# Patient Record
Sex: Male | Born: 1990 | Race: Black or African American | Hispanic: No | Marital: Single | Smoking: Never smoker
Health system: Southern US, Community
[De-identification: ages and names within clinical notes are randomized; demographics above are authoritative.]

## PROBLEM LIST (undated history)

## (undated) DIAGNOSIS — E119 Type 2 diabetes mellitus without complications: Secondary | ICD-10-CM

## (undated) DIAGNOSIS — I671 Cerebral aneurysm, nonruptured: Secondary | ICD-10-CM

## (undated) DIAGNOSIS — I10 Essential (primary) hypertension: Secondary | ICD-10-CM

## (undated) HISTORY — DX: Type 2 diabetes mellitus without complications: E11.9

## (undated) HISTORY — DX: Essential (primary) hypertension: I10

---

## 2016-07-10 DIAGNOSIS — I671 Cerebral aneurysm, nonruptured: Secondary | ICD-10-CM

## 2016-07-10 HISTORY — DX: Cerebral aneurysm, nonruptured: I67.1

## 2017-07-18 ENCOUNTER — Inpatient Hospital Stay: Admission: EM | Admit: 2017-07-18 | Payer: Self-pay

## 2017-07-19 ENCOUNTER — Emergency Department: Payer: Self-pay

## 2017-07-19 ENCOUNTER — Observation Stay
Admission: EM | Admit: 2017-07-19 | Discharge: 2017-07-19 | Disposition: A | Discharge status: Home / Self Care | Source: Other Acute Inpatient Hospital | Attending: Surgery | Admitting: Surgery

## 2017-07-19 DIAGNOSIS — S060X9A Concussion with loss of consciousness of unspecified duration, initial encounter: Principal | ICD-10-CM | POA: Insufficient documentation

## 2017-07-19 DIAGNOSIS — S060XAA Concussion with loss of consciousness status unknown, initial encounter: Secondary | ICD-10-CM | POA: Diagnosis present

## 2017-07-19 DIAGNOSIS — I10 Essential (primary) hypertension: Secondary | ICD-10-CM | POA: Insufficient documentation

## 2017-07-19 DIAGNOSIS — Z91018 Allergy to other foods: Secondary | ICD-10-CM | POA: Insufficient documentation

## 2017-07-19 DIAGNOSIS — E119 Type 2 diabetes mellitus without complications: Secondary | ICD-10-CM | POA: Insufficient documentation

## 2017-07-19 DIAGNOSIS — Y92410 Unspecified street and highway as the place of occurrence of the external cause: Secondary | ICD-10-CM | POA: Insufficient documentation

## 2017-07-19 DIAGNOSIS — Z888 Allergy status to other drugs, medicaments and biological substances status: Secondary | ICD-10-CM | POA: Insufficient documentation

## 2017-07-19 DIAGNOSIS — R402142 Coma scale, eyes open, spontaneous, at arrival to emergency department: Secondary | ICD-10-CM | POA: Insufficient documentation

## 2017-07-19 DIAGNOSIS — S060X0A Concussion without loss of consciousness, initial encounter: Secondary | ICD-10-CM

## 2017-07-19 DIAGNOSIS — Z91012 Allergy to eggs: Secondary | ICD-10-CM | POA: Insufficient documentation

## 2017-07-19 DIAGNOSIS — R402252 Coma scale, best verbal response, oriented, at arrival to emergency department: Secondary | ICD-10-CM | POA: Insufficient documentation

## 2017-07-19 DIAGNOSIS — R402362 Coma scale, best motor response, obeys commands, at arrival to emergency department: Secondary | ICD-10-CM | POA: Insufficient documentation

## 2017-07-19 LAB — VH DEXTROSE STICK GLUCOSE: Glucose POCT: 101 mg/dL — ABNORMAL HIGH (ref 71–99)

## 2017-07-19 MED ORDER — ACETAMINOPHEN 500 MG PO TABS
1000.00 mg | ORAL_TABLET | Freq: Four times a day (QID) | ORAL | 0 refills | Status: AC
Start: 2017-07-19 — End: 2017-07-26

## 2017-07-19 MED ORDER — ONDANSETRON HCL 4 MG/2ML IJ SOLN
4.00 mg | INTRAMUSCULAR | Status: DC | PRN
Start: 2017-07-19 — End: 2017-07-19
  Administered 2017-07-19: 05:00:00 4 mg via INTRAVENOUS
  Filled 2017-07-19: qty 2

## 2017-07-19 MED ORDER — SODIUM CHLORIDE 0.9 % IV SOLN
INTRAVENOUS | Status: DC
Start: 2017-07-19 — End: 2017-07-19

## 2017-07-19 MED ORDER — ACETAMINOPHEN 325 MG PO TABS
650.0000 mg | ORAL_TABLET | ORAL | Status: DC | PRN
Start: 2017-07-19 — End: 2017-07-19
  Administered 2017-07-19: 650 mg via ORAL
  Filled 2017-07-19: qty 2
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
## 2017-07-19 NOTE — ED Notes (Signed)
Bed: NA10-A  Expected date:   Expected time:   Means of arrival:   Comments:  Transfer  0100
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
## 2017-07-19 NOTE — Discharge Instructions (Signed)
After a Concussion     Awaken to check alertness as often as the health care provider suggests.     If you had a mild concussion (a head injury), watch closely for signs of problems during the first 48 hours after the injury. Follow the doctor's advice about recovering at home. Use the tips on this handout as a guide.  Note: You should not be left alone after a concussion. If no adult can stay with the injured person, let the doctor know.  Have someone call 911 or your emergency number if you can't fully wake up or have a seizures or convulsions.   The first 48 hours  Don't take medicine unless approved by your healthcare provider. Try placing a cold, damp cloth on your head to help relieve a headache.   Ask the doctor before using any medicines.   Don't drink alcohol or take sedatives or medicines that make you sleepy.   Don't return to sports or any activity that could cause you to hit your head until all symptoms are gone and you have been cleared by your doctor. A second head injury before fully recovering from the first one can lead to serious brain injury.   Don't do activities that need a lot of concentration or a lot of attention. This will allow your brain to rest and heal more quickly.   Return to regular physical and mental activity as directed and approved by your healthcare provider.  Tips about sleeping  For the first day or two, it may be best not to sleep for long periods of time without being checked for alertness. Follow the doctor's instructions.  ? Have someone wake you every ____ hours for the next ____ hours. He or she should ask you questions to check for alertness.  ?OK to sleep through the night.    When to call the healthcare provider  If you notice any of the following, call the healthcare provider:   Vomiting. Some vomiting is common, but tell the provider about any vomiting.   Clear or bloody drainage from the nose or ear   Constant drowsiness or difficulty in waking  up   Confusion or memory loss   Blurred vision or any vision changes   Inability to walk or talk normally   Increased weakness or problems with coordination   Constant, unrelieved headache that becomes more severe   Changes in behavior or personality   High-pitched crying in infants   Signs of stroke such as paralysis of parts of the body   Uncrontrolled movements suggesting a seizure   Date Last Reviewed: 06/09/2016   2000-2018 [employer]. [address], [place], [place] [address]. All rights reserved. This information is not intended as a substitute for professional medical care. Always follow your healthcare professional's instructions.        Treatment for Mild Traumatic Brain Injury (Concussion)  A mild traumatic brain injury (TBI) is a sudden jolt to your head that causes a temporary change in the way your brain works. It could be caused by a blow to your head, a blast, or a sudden and severe movement of your head that bounces your brain inside your skull. Falls, fights, sports, and car accidents also can cause TBIs.  Treatment of mild TBI  Having a mild TBI can change the way you feel, act, move, and think. Even though you may look fine, a mild TBI can have a big impact on many areas of  your life. A mild TBI can cause headaches, fatigue, memory problems, mood swings, and inability to focus your thoughts.  Treatment for mild TBI may be different, depending on symptoms and other unrelated medical issues; therefore, no 2 TBIs are the same. You may need to work with a TBI team -- a group of healthcare providers who help people recover from TBI. For example, you might work with a physical therapist to help with your balance and movement problems. Or you might work with an occupational therapist to help you function better at home and at work. Other medical experts may help you with emotional and thinking problems.  In some cases, your doctor may use medicine to ease symptoms while you  recover. These may include pain relievers, antidepressants, antianxiety medicine, sleep aids, and muscle relaxants. Although medicines can help, they are not a main part of treatment. You should not take any medicines unless discussed and approved by your healthcare provider. Things that you can do for yourself are usually as important as the medicines you are prescribed. This part of your treatment is called self-management.  Self-management for mild TBI  Most people with mild TBI recover completely, but it may take weeks or months. For some people, symptoms may continue for years. Because of this, self-management may continue long after you leave the hospital. Many lifestyle changes that help your brain recover are good habits that you should keep up even after you have recovered. Here are some tips:   Learn as much as you can about TBI. Share what you learn with friends and family.   Let your health care team know about all your symptoms.   Eat a healthy diet and drink plenty of fluids.   Get plenty of sleep.   Early return to normal activities is proven to help more than prolonged rest in certain cases.   Music is believed to activate the brain and promote brain healing.   Don't overexert yourself mentally or physically.   Don't smoke, take drugs,or drink alcohol.   Don't use caffeine or energy drinks as a way to make you feel less tired.   Avoid activities that could cause another jolt to your head.Don't return to sports or any activity that could cause you to hit your head until all symptoms are gone and you have been cleared by your doctor. A second head injury before fully recovering from the first one can lead to serious brain injury. Ask your health care provider what types of activities are safe.   Avoid mental stress. Learn some relaxation techniques like deep breathing.   Keep a pad and pencil handy. Write things down if you are having trouble concentrating or remembering.  Let your  healthcare provider know if your symptoms are getting worse. And look out for other important mental symptoms. These include memory loss, having a hard time thinking clearly, having trouble controlling your emotions, and depression. Also be sure to report physical symptoms such as worsening headaches, loss of balance, changes in vision, seizures, and vomiting.  Recovery from a mild TBI takes time. Be patient and give your brain time to heal. Be sure to rely on your support system, which includes friends and family members who understand what you are going through. You might also want to join a support group and share your feelings with others who have had a TBI.   Date Last Reviewed: 07/10/2016   2000-2018 [employer] [place]. [address], [place], [place] [address]. All  rights reserved. This information is not intended as a substitute for professional medical care. Always follow your healthcare professional's instructions.        What Is Traumatic Brain Injury?    A traumatic brain injury (TBI) is a sudden jolt to your head that changes the way your brain works. The jolt could be caused by a blow to your head, a blast, or an object like a bullet or fragment entering your brain.Falls, fights, combat, sports, and motor vehicle accidents are other common causes.  Types of TBI  A TBI can be mild, moderate, or severe. Most TBIs are mild. Some medical groups refer to a mild TBI as a concussion. Other groups view concussion as a milder subset of TBI. If you have a mild TBI, you might be knocked out for a short time, or you might just feel stunned for a while. With a moderate or severe TBI, you will be unconscious for longer. Your healthcare team will decide how serious your TBI is based on the symptoms at the time of the trauma, as well as afterward. A tool called the Glasgow Coma Scale (GCS) is often used to rate the severity of TBI. Symptoms of TBI are unpredictable. This means you could have a mild TBI and  actually have symptoms that last longer than someone with a more severe TBI.  Types of damage  When the brain strikes the skull or twists on the brain stem, brain tissue tears. This injury may then cause a second type of damage, such as bleeding or swelling in the brain. Healthcare providers try to control the second type of damage to help limit long-term problems.  Tearing  If nerve fibers in the brain tissue tear, signals can't pass between the brain and body. Lost signals mean changedskills or body functions.  Bleeding  A torn blood vessel may leak into nearby tissue. This kills brain cells and can lead to a buildup of blood (hematoma). If this blood presses on the brain, it can cut off blood to other cells. These cells also die.  Swelling  The brain has almost no room to expand inside the skull. If the brain swells, it may press against the skull. As the pressure increases, the brain functions may be changed.  Symptoms of TBI  Having a TBI can change your brain in many ways. A TBI can change the way you think, feel, act, and move. The symptoms depend on the part of your brain that is injured. Common symptoms of mild TBI can include:   Headache   Confusion   Loss of consciousness   Dizziness   Blurry vision   Ringing in your ears   Feeling tired   Loss of memory   Mood changes   Trouble sleeping   Being off balance   Being bothered by bright light   Feeling sick to your stomach  Symptoms of moderate or severe TBI may include all the symptoms of a mild TBI, plus any or all of these symptoms:   Extended loss of consciousness   Severe headache that does not go away   Repeated vomiting   Seizures   Extreme sleepiness   Slurred speech   Weakness and numbness in your arms and legs   Being very clumsy   Being very irritable, restless, or confused  Recovering from TBI  Most people recover completely from a mild TBI. It may take days or weeks. If you have had more than one TBI, your recovery  may  take longer. Everyone's brain is different, so your recovery time and treatments will depend on how your brain is healing. Here are some tips to help your recovery:   Be honest with yourhealthcareteam and let them know about all your symptoms.   Let yourhealthcare providerknow right away if your symptoms are getting worse or new symptoms appear.   Make sure to keep all your appointments and follow your healthcare provider's instructions carefully.   Give your brain time to heal. Be patient and get plenty of rest.   Don't smoke, take drugs,or drink alcohol.   Don't take anymedicines without checking with yourhealthcare providerfirst. This includes over-the-counter medicines.   Your healthcare provider might suggest that you take a new medicine if you have problems managing your emotions, such as laughing and crying uncontrollably.The medicine is a combination of dextromethorphan and quinidine.  Recovery from a TBI is unpredictable and is different from person to person. Most people do recover completely from most TBIs. Remember that a TBI can change the way you think, feel, move, and act. The best way to recover is to let your family andhealthcareteam know about all your symptoms. Work closely with your healthcare team and give your brain time to heal.  Date Last Reviewed: 06/09/2016   2000-2018 [employer]. [address] [place], [place] [address]. All rights reserved. This information is not intended as a substitute for professional medical care. Always follow your healthcare professional's instructions.
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
## 2017-07-19 NOTE — Discharge Instr - AVS First Page (Signed)
Diet:  Resume previous home diet as tolerated.  No restrictions.    Activity:  No strenuous activity or lifting greater than 10-15 pounds x 1 weeks.  Encourage ambulation as tolerated.  May use stairs if needed.  No working or driving while on narcotic pain medications.    Medications:  Refer to the After Visit Summary for updates and new medications.  Take over-the-counter  Tylenol (aka acetaminophen) on a scheduled basis as directed.  Use over-the-counter stool softeners and/or Senokot daily to twice daily as needed for constipation.    Instructions:  Call/Return if temperature over 101.0, refractory nausea/vomiting, increased pain not relieved by medications, or new onset of chest pain or shortness or breath.  Call/Return for new onset numbness or tingling of the extremities or increased confusion.    For additional questions or concerns after your discharge, you may contact the ACCESS office at [phone] between the hours of 8:00am - 4:30pm.  Any medication refill requests should be called to the office during daytime business hours.    If you have an urgent need that occurs after office hours, call [phone] and ask to speak to the ACCESS physician on call.  No medication refills will be handled outside of business hours.  Emergencies should go to the Emergency Room and cannot be handled over the phone.    ACCESS BOWEL MAINTENANCE GUIDELINES    The use of narcotic pain medications and decreased activity will lead to constipation.  In order to prevent this from happening to you, please follow these recommendations:  1. Drink adequate amounts of fluid throughout the day.    2. Take an over-the-counter stool softener, such as Colace, once or twice a day depending on the firmness of your stools.  3. You may take another over-the-counter stool softener, such as Senokot or generic Senna, two tablets at bedtime.  4. Add Metamucil or other similar fiber supplement to your diet twice a day.    If constipation  persists:   1. Add Milk of Magnesia two (2) tablespoons (30mL) every 6    hours (up to 8 doses).  2. If you are experiencing rectal fullness or pressure, try a Dulcolax and/or   glycerin suppository every 12 hours.    . Avoid anti-diarrhea medications once your bowels begin to move.  Loose         stools should resolve on their own after 2-3 days.  . Gas relief medications such as Gas-X or Gaviscon are acceptable for use.    . Probiotic food products, such as yogurt, are acceptable especially if you are or have been on antibiotics.    Additional Recipe for Success (Natural Stool Softener):    -1 cup of unsweetened applesauce,   - cup of unsweetened prune juice,   -1 cup of unprocessed bran (ex., Miller's brand - can be found at  [employer], [employer],        [place], or other health food stores).    Mix together and store in refrigerator.    Take 2 tablespoons with a large glass of water daily and expect results 6-8 hours later.  You may add 1 tablespoon every 5-7 days if needed.   *can be used as long as desired, non-addictive, & gentle
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
## 2017-07-19 NOTE — ED Notes (Signed)
Pt updated on plan of care, no change since most recent assessment, denies needs at this time. Report called to receiving RN. Pt not wearing soft collar at this time, told this RN Dr Ambrose Finland removed collar.
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
## 2018-05-15 ENCOUNTER — Emergency Department (HOSPITAL_COMMUNITY)
Admission: EM | Admit: 2018-05-15 | Discharge: 2018-05-15 | Disposition: A | Discharge status: Home / Self Care | Payer: Non-veteran care | Attending: Emergency Medicine | Admitting: Emergency Medicine

## 2018-05-15 ENCOUNTER — Other Ambulatory Visit: Payer: Self-pay

## 2018-05-15 ENCOUNTER — Encounter (HOSPITAL_COMMUNITY): Payer: Self-pay | Admitting: Emergency Medicine

## 2018-05-15 ENCOUNTER — Emergency Department (HOSPITAL_COMMUNITY): Payer: Non-veteran care

## 2018-05-15 DIAGNOSIS — Z7289 Other problems related to lifestyle: Secondary | ICD-10-CM

## 2018-05-15 DIAGNOSIS — F1099 Alcohol use, unspecified with unspecified alcohol-induced disorder: Secondary | ICD-10-CM | POA: Insufficient documentation

## 2018-05-15 DIAGNOSIS — M79604 Pain in right leg: Secondary | ICD-10-CM | POA: Insufficient documentation

## 2018-05-15 DIAGNOSIS — G43809 Other migraine, not intractable, without status migrainosus: Secondary | ICD-10-CM | POA: Insufficient documentation

## 2018-05-15 DIAGNOSIS — Z8679 Personal history of other diseases of the circulatory system: Secondary | ICD-10-CM | POA: Insufficient documentation

## 2018-05-15 DIAGNOSIS — E119 Type 2 diabetes mellitus without complications: Secondary | ICD-10-CM | POA: Insufficient documentation

## 2018-05-15 DIAGNOSIS — Z789 Other specified health status: Secondary | ICD-10-CM

## 2018-05-15 DIAGNOSIS — I10 Essential (primary) hypertension: Secondary | ICD-10-CM | POA: Insufficient documentation

## 2018-05-15 HISTORY — DX: Cerebral aneurysm, nonruptured: I67.1

## 2018-05-15 HISTORY — DX: Essential (primary) hypertension: I10

## 2018-05-15 HISTORY — DX: Type 2 diabetes mellitus without complications: E11.9

## 2018-05-15 LAB — COMPREHENSIVE METABOLIC PANEL
ALBUMIN: 4.2 g/dL (ref 3.5–5.0)
ALT: 25 U/L (ref 0–44)
AST: 31 U/L (ref 15–41)
Alkaline Phosphatase: 54 U/L (ref 38–126)
Anion gap: 8 (ref 5–15)
BILIRUBIN TOTAL: 1.1 mg/dL (ref 0.3–1.2)
BUN: 9 mg/dL (ref 6–20)
CO2: 25 mmol/L (ref 22–32)
Calcium: 9.4 mg/dL (ref 8.9–10.3)
Chloride: 107 mmol/L (ref 98–111)
Creatinine, Ser: 1.01 mg/dL (ref 0.61–1.24)
GFR calc Af Amer: >60 mL/min (ref >60)
GFR calc non Af Amer: >60 mL/min (ref >60)
GLUCOSE: 103 mg/dL — AB (ref 70–99)
POTASSIUM: 4.2 mmol/L (ref 3.5–5.1)
SODIUM: 140 mmol/L (ref 135–145)
TOTAL PROTEIN: 7.2 g/dL (ref 6.5–8.1)

## 2018-05-15 LAB — CBC
HEMATOCRIT: 44.8 % (ref 39.0–52.0)
HEMOGLOBIN: 15.3 g/dL (ref 13.0–17.0)
MCH: 30.1 pg (ref 26.0–34.0)
MCHC: 34.2 g/dL (ref 30.0–36.0)
MCV: 88 fL (ref 80.0–100.0)
Platelets: 231 10*3/uL (ref 150–400)
RBC: 5.09 MIL/uL (ref 4.22–5.81)
RDW: 12 % (ref 11.5–15.5)
WBC: 9.7 10*3/uL (ref 4.0–10.5)
nRBC: 0 % (ref 0.0–0.2)

## 2018-05-15 LAB — ETHANOL: ALCOHOL ETHYL (B): 85 mg/dL — AB (ref <10)

## 2018-05-15 LAB — CBG MONITORING, ED: Glucose-Capillary: 100 mg/dL — ABNORMAL HIGH (ref 70–99)

## 2018-05-15 NOTE — ED Provider Notes (Signed)
[facility] EMERGENCY DEPARTMENT Provider Note  CSN: [id number] Arrival date & time: 05/15/18 0334  Chief Complaint(s) Altered Mental Status Triage Note 0350 BIB EMS from home for AMS. Per EMS, pt initially c/o anxiety with weakness & inability to walk. EMS reports some R-sided weakness. Pt unable to answer orientation questions appropriately. Became combative with EMS, refusing transport. Given 5mg  midazolam. V/S WNL.    HPI Mark Collins is a 27 y.o. male with a history of hypertension, diabetes, migraine headaches who presents to the emergency department with altered mental status.  Remainder of history, ROS, and physical exam limited due to patient's condition (somnolence, sedation). Additional information was obtained from girl friend.   Level V Caveat.  Patient did admit to drinking 1 beer.  Denied any illicit drug use.  Comfort reported that the patient was complaining of right leg weakness.  HPI  Past Medical History Past Medical History:  Diagnosis Date  . Brain aneurysm 2018  . Diabetes mellitus without complication (HCC)   . Hypertension    There are no active problems to display for this patient.  Home Medication(s) Prior to Admission medications   Not on File                                                                                                                                    Past Surgical History History reviewed. No pertinent surgical history. Family History History reviewed. No pertinent family history.  Social History Social History   Tobacco Use  . Smoking status: Never Smoker  . Smokeless tobacco: Never Used  Substance Use Topics  . Alcohol use: Yes  . Drug use: Not Currently   Allergies Patient has no known allergies.  Review of Systems Review of Systems  Unable to perform ROS: Mental status change    Physical Exam Vital Signs  I have reviewed the triage vital signs BP (!) 119/56 (BP Location: Right Arm)    Pulse 96   Temp 97.8 F (36.6 C) (Oral)   Resp 18   Ht 5\' 9"  (1.753 m)   Wt 74.8 kg   SpO2 97%   BMI 24.37 kg/m   Physical Exam  Constitutional: He is oriented to person, place, and time. He appears well-developed and well-nourished. No distress.  HENT:  Head: Normocephalic and atraumatic.  Right Ear: External ear normal.  Left Ear: External ear normal.  Nose: Nose normal.  Mouth/Throat: Mucous membranes are normal. No trismus in the jaw.  Eyes: Conjunctivae and EOM are normal. No scleral icterus.  Neck: Normal range of motion and phonation normal. No spinous process tenderness and no muscular tenderness present. Normal range of motion present.  Cardiovascular: Normal rate and regular rhythm.  Pulmonary/Chest: Effort normal. No stridor. No respiratory distress.  Abdominal: He exhibits no distension.  Musculoskeletal: Normal range of motion. He exhibits no edema.  Neurological: He is alert and oriented to person, place, and  time.  Moves all extremities with 5/5 strength  Skin: He is not diaphoretic.  Psychiatric: He has a normal mood and affect. His behavior is normal.  Vitals reviewed.   ED Results and Treatments Labs (all labs ordered are listed, but only abnormal results are displayed) Labs Reviewed  COMPREHENSIVE METABOLIC PANEL - Abnormal; Notable for the following components:      Result Value   Glucose, Bld 103 (*)    All other components within normal limits  ETHANOL - Abnormal; Notable for the following components:   Alcohol, Ethyl (B) 85 (*)    All other components within normal limits  CBG MONITORING, ED - Abnormal; Notable for the following components:   Glucose-Capillary 100 (*)    All other components within normal limits  CBC  RAPID URINE DRUG SCREEN, HOSP PERFORMED  CBG MONITORING, ED                                                                                                                         EKG  EKG Interpretation  Date/Time:      Ventricular Rate:    PR Interval:    QRS Duration:   QT Interval:    QTC Calculation:   R Axis:     Text Interpretation:        Radiology Ct Head Wo Contrast  Result Date: 05/15/2018 CLINICAL DATA:  Altered mental status. Right-sided weakness. Multiple falls. History of hypertension, diabetes, brain aneurysm. EXAM: CT HEAD WITHOUT CONTRAST TECHNIQUE: Contiguous axial images were obtained from the base of the skull through the vertex without intravenous contrast. COMPARISON:  None. FINDINGS: Brain: No evidence of acute infarction, hemorrhage, hydrocephalus, extra-axial collection or mass lesion/mass effect. Vascular: No hyperdense vessel or unexpected calcification. No aneurysm or postoperative changes identified. Skull: Calvarium appears intact. Sinuses/Orbits: Paranasal sinuses demonstrate mild mucosal thickening with opacification of some of the ethmoid air cells, likely inflammatory. No acute air-fluid levels. Mastoid air cells are clear. Other: None. IMPRESSION: No acute intracranial abnormalities. Electronically Signed   By: Burman Nieves M.D.   On: 05/15/2018 04:57   Pertinent labs & imaging results that were available during my care of the patient were reviewed by me and considered in my medical decision making (see chart for details).  Medications Ordered in ED Medications - No data to display  Procedures Procedures  (including critical care time)  Medical Decision Making / ED Course I have reviewed the nursing notes for this encounter and the patient's prior records (if available in EHR or on provided paperwork).    Patient somnolent/sedated following Versed.  He is sleepy but arousable and oriented x3.  Endorsed drinking 1 beer.  Denies any recent fevers or infections.  Endorses daily headaches.  CT head negative.  Screening labs negative.  EtOH  mildly elevated.   Girlfriend at bedside reporting patient's mental status is significantly improved and at his baseline.  On reevaluation, patient is more alert and awake.  5 out of 5 strength in all extremities.  Endorsing right lateral knee pain along the bicep femoralis tendon. No bony tenderness. He declined plain film.  The patient appears reasonably screened and/or stabilized for discharge and I doubt any other medical condition or other [facility] requiring further screening, evaluation, or treatment in the ED at this time prior to discharge.  The patient is safe for discharge with strict return precautions.   Final Clinical Impression(s) / ED Diagnoses Final diagnoses:  Alcohol use  Migraine variant with headache  Right leg pain   Disposition: Discharge  Condition: Good  I have discussed the results, Dx and Tx plan with the patient who expressed understanding and agree(s) with the plan. Discharge instructions discussed at great length. The patient was given strict return precautions who verbalized understanding of the instructions. No further questions at time of discharge.    ED Discharge Orders    None       [facility]  Schedule an appointment as soon as possible for a visit  As needed      This chart was dictated using voice recognition software.  Despite best efforts to proofread,  errors can occur which can change the documentation meaning.   Nira Conn, MD 05/15/18 [phone]

## 2018-05-15 NOTE — ED Triage Notes (Signed)
BIB EMS from home for AMS. Per EMS, pt initially c/o anxiety with weakness & inability to walk. EMS reports some R-sided weakness. Pt unable to answer orientation questions appropriately. Became combative with EMS, refusing transport. Given 5mg  midazolam. V/S WNL.

## 2018-05-15 NOTE — ED Notes (Signed)
Patient verbalized understanding of discharge instructions and denies any further needs or questions at this time. VS stable. Patient ambulatory with steady gait. Assisted to vehicle in wheelchair.   
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
## 2018-05-15 NOTE — ED Notes (Signed)
Patient transported to CT 

## 2018-05-15 NOTE — ED Notes (Signed)
Pt reminded of need for urine sample. Urinal placed at bedside.

## 2018-09-28 ENCOUNTER — Emergency Department
Admission: EM | Admit: 2018-09-28 | Discharge: 2018-09-28 | Disposition: A | Discharge status: Home / Self Care | Payer: Self-pay | Attending: Emergency Medicine | Admitting: Emergency Medicine

## 2018-09-28 ENCOUNTER — Emergency Department: Payer: Self-pay

## 2018-09-28 DIAGNOSIS — Y990 Civilian activity done for income or pay: Secondary | ICD-10-CM | POA: Insufficient documentation

## 2018-09-28 DIAGNOSIS — Z8679 Personal history of other diseases of the circulatory system: Secondary | ICD-10-CM | POA: Insufficient documentation

## 2018-09-28 DIAGNOSIS — E108 Type 1 diabetes mellitus with unspecified complications: Secondary | ICD-10-CM | POA: Insufficient documentation

## 2018-09-28 DIAGNOSIS — Z794 Long term (current) use of insulin: Secondary | ICD-10-CM | POA: Insufficient documentation

## 2018-09-28 DIAGNOSIS — R569 Unspecified convulsions: Secondary | ICD-10-CM | POA: Insufficient documentation

## 2018-09-28 DIAGNOSIS — Z8669 Personal history of other diseases of the nervous system and sense organs: Secondary | ICD-10-CM | POA: Insufficient documentation

## 2018-09-28 DIAGNOSIS — Y9269 Other specified industrial and construction area as the place of occurrence of the external cause: Secondary | ICD-10-CM | POA: Insufficient documentation

## 2018-09-28 DIAGNOSIS — I10 Essential (primary) hypertension: Secondary | ICD-10-CM | POA: Insufficient documentation

## 2018-09-28 LAB — COMPREHENSIVE METABOLIC PANEL
ALT: 34 U/L (ref 0–55)
AST (SGOT): 28 U/L (ref 5–34)
Albumin/Globulin Ratio: 1.3 (ref 0.9–2.2)
Albumin: 4.4 g/dL (ref 3.5–5.0)
Alkaline Phosphatase: 63 U/L (ref 38–106)
BUN: 11 mg/dL (ref 9.0–28.0)
Bilirubin, Total: 1.7 mg/dL — ABNORMAL HIGH (ref 0.2–1.2)
CO2: 19 mEq/L — ABNORMAL LOW (ref 22–29)
Calcium: 10 mg/dL (ref 8.5–10.5)
Chloride: 104 mEq/L (ref 100–111)
Creatinine: 1.1 mg/dL (ref 0.7–1.3)
Globulin: 3.3 g/dL (ref 2.0–3.6)
Glucose: 96 mg/dL (ref 70–100)
Potassium: 4.4 mEq/L (ref 3.5–5.1)
Protein, Total: 7.7 g/dL (ref 6.0–8.3)
Sodium: 138 mEq/L (ref 136–145)

## 2018-09-28 LAB — RAPID DRUG SCREEN, URINE
Barbiturate Screen, UR: NEGATIVE
Benzodiazepine Screen, UR: NEGATIVE
Cannabinoid Screen, UR: NEGATIVE
Cocaine, UR: NEGATIVE
Opiate Screen, UR: NEGATIVE
PCP Screen, UR: NEGATIVE
Urine Amphetamine Screen: NEGATIVE

## 2018-09-28 LAB — CBC AND DIFFERENTIAL
Absolute NRBC: 0 10*3/uL (ref 0.00–0.00)
Basophils Absolute Automated: 0.05 10*3/uL (ref 0.00–0.08)
Basophils Automated: 1 %
Eosinophils Absolute Automated: 0.45 10*3/uL — ABNORMAL HIGH (ref 0.00–0.44)
Eosinophils Automated: 9.3 %
Hematocrit: 46.2 % (ref 37.6–49.6)
Hgb: 16.5 g/dL (ref 12.5–17.1)
Immature Granulocytes Absolute: 0.01 10*3/uL (ref 0.00–0.07)
Immature Granulocytes: 0.2 %
Lymphocytes Absolute Automated: 1.93 10*3/uL (ref 0.42–3.22)
Lymphocytes Automated: 40 %
MCH: 30.7 pg (ref 25.1–33.5)
MCHC: 35.7 g/dL (ref 31.5–35.8)
MCV: 85.9 fL (ref 78.0–96.0)
MPV: 10.3 fL (ref 8.9–12.5)
Monocytes Absolute Automated: 0.46 10*3/uL (ref 0.21–0.85)
Monocytes: 9.5 %
Neutrophils Absolute: 1.93 10*3/uL (ref 1.10–6.33)
Neutrophils: 40 %
Nucleated RBC: 0 /100 WBC (ref 0.0–0.0)
Platelets: 253 10*3/uL (ref 142–346)
RBC: 5.38 10*6/uL (ref 4.20–5.90)
RDW: 13 % (ref 11–15)
WBC: 4.83 10*3/uL (ref 3.10–9.50)

## 2018-09-28 LAB — GFR: EGFR: >60

## 2018-09-28 LAB — ETHANOL: Alcohol: NOT DETECTED mg/dL

## 2018-09-28 LAB — GLUCOSE WHOLE BLOOD - POCT: Whole Blood Glucose POCT: 101 mg/dL — ABNORMAL HIGH (ref 70–100)

## 2018-09-28 MED ORDER — LEVETIRACETAM IN NACL 1000 MG/100ML IV SOLN
1000.00 mg | Freq: Once | INTRAVENOUS | Status: AC
Start: 2018-09-28 — End: 2018-09-28
  Administered 2018-09-28: 12:00:00 1000 mg via INTRAVENOUS
  Filled 2018-09-28: qty 100

## 2018-09-28 MED ORDER — LORAZEPAM 2 MG/ML IJ SOLN
1.00 mg | Freq: Once | INTRAMUSCULAR | Status: AC
Start: 2018-09-28 — End: 2018-09-28

## 2018-09-28 MED ORDER — TOPIRAMATE 25 MG PO TABS
50.00 mg | ORAL_TABLET | Freq: Once | ORAL | Status: AC
Start: 2018-09-28 — End: 2018-09-28
  Administered 2018-09-28: 10:00:00 50 mg via ORAL
  Filled 2018-09-28: qty 2

## 2018-09-28 MED ORDER — LORAZEPAM 2 MG/ML IJ SOLN
INTRAMUSCULAR | Status: AC
Start: 2018-09-28 — End: 2018-09-28
  Administered 2018-09-28: 09:00:00 1 mg via INTRAVENOUS
  Filled 2018-09-28: qty 1

## 2018-09-28 MED ORDER — TOPIRAMATE 100 MG PO TABS
100.00 mg | ORAL_TABLET | Freq: Once | ORAL | Status: DC
Start: 2018-09-28 — End: 2018-09-28
  Filled 2018-09-28: qty 1

## 2018-09-28 MED ORDER — LEVETIRACETAM 250 MG PO TABS
500.00 mg | ORAL_TABLET | Freq: Two times a day (BID) | ORAL | 0 refills | Status: AC
Start: 2018-09-28 — End: 2018-10-28

## 2018-09-28 MED ORDER — SUMATRIPTAN SUCCINATE 6 MG/0.5ML SC SOLN
6.00 mg | Freq: Once | SUBCUTANEOUS | Status: AC
Start: 2018-09-28 — End: 2018-09-28
  Administered 2018-09-28: 10:00:00 6 mg via SUBCUTANEOUS
  Filled 2018-09-28: qty 0.5

## 2018-09-28 NOTE — ED Provider Notes (Signed)
EMERGENCY DEPARTMENT HISTORY AND PHYSICAL EXAM      Date: 09/28/18  Patient Name: Chase Holden  Attending Physician: Orlinda Blalock, MD      ATTENDING NOTE:    I have reviewed and agree with history. I have seen and examined this patient. My pertinent physical exam has been documented. I was present for key portions of any procedures performed. The patient was seen and examined by the PA or resident. The plan of care was discussed with me.     Diagnosis and Treatment Plan       Clinical Impression:   1. Seizure        Treatment Plan:   ED Disposition     ED Disposition Condition Date/Time Comment    Discharge  Sat Sep 28, 2018 11:46 AM Chase Holden discharge to home/self care.    Condition at disposition: Stable          History of Presenting Illness     Chief Complaint   Patient presents with    Seizures       Chase Holden is a 28 y.o. male who presents with 2 seizures today approx onew hour prior.   History of SDH, DM (insulin dependent), HTN, seizures (never on medication for seizures).   EMS reports that he had 2 seizures today over a 15 minute period, witnessed just PTA lasting approximately several minutes which involved full body shaking. Per them, he was post ictal during transport.     On arrival patient is agitated, yelling, confused.     EMS denies known fall or head trauma, tongue injury, incontinence, vomiting.     EM caveat : change in mental status      PCP: Pcp, None, MD     Caveat: unable to complete patient history due to pt mental status.     Past Medical History     Past Medical History:   Diagnosis Date    Diabetes mellitus     Hypertension      Past Surgical History:   Procedure Laterality Date    BRAIN SURGERY         Family History     No family history on file.    Social History     Social History     Socioeconomic History    Marital status: Single     Spouse name: Not on file    Number of children: Not on file    Years of education: Not on file     Highest education level: Not on file   Occupational History    Not on file   Social Needs    Financial resource strain: Not on file    Food insecurity:     Worry: Not on file     Inability: Not on file    Transportation needs:     Medical: Not on file     Non-medical: Not on file   Tobacco Use    Smoking status: Never Smoker    Smokeless tobacco: Never Used   Substance and Sexual Activity    Alcohol use: No    Drug use: No    Sexual activity: Not on file   Lifestyle    Physical activity:     Days per week: Not on file     Minutes per session: Not on file    Stress: Not on file   Relationships    Social connections:     Talks on phone: Not on  file     Gets together: Not on file     Attends religious service: Not on file     Active member of club or organization: Not on file     Attends meetings of clubs or organizations: Not on file     Relationship status: Not on file    Intimate partner violence:     Fear of current or ex partner: Not on file     Emotionally abused: Not on file     Physically abused: Not on file     Forced sexual activity: Not on file   Other Topics Concern    Not on file   Social History Narrative    Not on file       Allergies     Allergies   Allergen Reactions    Epipen [Epinephrine] Anaphylaxis    Eggs Or Egg-Derived Products     Poultry     Soybean Oil        Medications     No current facility-administered medications for this encounter.     Current Outpatient Medications:     levETIRAcetam (KEPPRA) 250 MG tablet, Take 2 tablets (500 mg total) by mouth 2 (two) times daily, Disp: 120 tablet, Rfl: 0    Review of Systems     Review of Systems : All systems were reviewed and are negative for acute complaints, except as described above.     Physical Exam   BP 144/81    Pulse 79    Temp 97.4 F (36.3 C) (Oral)    Resp 12    Ht 5\' 9"  (1.753 m)    Wt 77.1 kg    SpO2 99%    BMI 25.10 kg/m     Constitutional: Patient is afebrile. Vital signs reviewed. Well appearing.   Head:  Atraumatic. Normocephalic.   Eyes: Eyes are normal to inspection. Sclera are nl.   Respiratory/Chest: Breath sounds are normal. No respiratory distress.  Cardiovascular: RRR. Hearts sounds normal. Normal S1 S2.  Abdomen: Abdomen is non-tender. No peritoneal signs.  Upper Extremity: Inspection normal. No cyanosis.  Lower Extremity: Inspection normal. No edema  Neuro: Speech normal. Oriented x3. Agitated but redirectable, moving all 4 extremities, 5/5 strength, opens eyes spontaneously, speaking in short but complete sentences. EOMI. Face symmetric. Speech fluid. No drift. Nl finger to nose.   Skin: Skin is warm. Skin is dry.  Psychiatric: Normal affect.       Diagnostic Study Results     Labs -  Results     Procedure Component Value Units Date/Time    Ethanol (Alcohol) Level [[id number]] Collected:  09/28/18 0905    Specimen:  Blood Updated:  09/28/18 0941     Alcohol None Detected mg/dL     GFR [[id number]] Collected:  09/28/18 0905     Updated:  09/28/18 0941     EGFR >60.0    Comprehensive metabolic panel [[id number]]  (Abnormal) Collected:  09/28/18 0905    Specimen:  Blood Updated:  09/28/18 0941     Glucose 96 mg/dL      BUN 44.0 mg/dL      Creatinine 1.1 mg/dL      Sodium 102 mEq/L      Potassium 4.4 mEq/L      Chloride 104 mEq/L      CO2 19 mEq/L      Calcium 10.0 mg/dL      Protein, Total 7.7 g/dL      Albumin 4.4 g/dL  AST (SGOT) 28 U/L      ALT 34 U/L      Alkaline Phosphatase 63 U/L      Bilirubin, Total 1.7 mg/dL      Globulin 3.3 g/dL      Albumin/Globulin Ratio 1.3    Rapid drug screen, urine [[id number]] Collected:  09/28/18 0905    Specimen:  Urine Updated:  09/28/18 0939     Amphetamine Screen, UR Negative     Barbiturate Screen, UR Negative     Benzodiazepine Screen, UR Negative     Cannabinoid Screen, UR Negative     Cocaine, UR Negative     Opiate Screen, UR Negative     PCP Screen, UR Negative    CBC and differential [[id number]]  (Abnormal) Collected:  09/28/18 0905    Specimen:  Blood Updated:   09/28/18 0932     WBC 4.83 x10 3/uL      Hgb 16.5 g/dL      Hematocrit 30.1 %      Platelets 253 x10 3/uL      RBC 5.38 x10 6/uL      MCV 85.9 fL      MCH 30.7 pg      MCHC 35.7 g/dL      RDW 13 %      MPV 10.3 fL      Neutrophils 40.0 %      Lymphocytes Automated 40.0 %      Monocytes 9.5 %      Eosinophils Automated 9.3 %      Basophils Automated 1.0 %      Immature Granulocyte 0.2 %      Nucleated RBC 0.0 /100 WBC      Neutrophils Absolute 1.93 x10 3/uL      Abs Lymph Automated 1.93 x10 3/uL      Abs Mono Automated 0.46 x10 3/uL      Abs Eos Automated 0.45 x10 3/uL      Absolute Baso Automated 0.05 x10 3/uL      Absolute Immature Granulocyte 0.01 x10 3/uL      Absolute NRBC 0.00 x10 3/uL     Glucose Whole Blood - POCT [[id number]]  (Abnormal) Collected:  09/28/18 5732     Updated:  09/28/18 0831     POCT - Glucose Whole blood 101 mg/dL           Radiologic Studies -  CT Head without Contrast   Final Result    No acute intracranial abnormality is identified.      Nicoletta Dress, MD    09/28/2018 8:50 AM            Clinical Course in the Emergency Department     reassess: pt awake, ox 3. No confused. Answering questions appropriately, able to give medical history including previous history of seizures which were worked up but not treated.        Discharge note:  Pt reevaluated prior to discharge. ambulating without difficulty. Tolerating po. Discharge instructions reviewed with the patient. Pt appears stable for discharge and has been instructed to follow up with PCP or return to the ED if their symptoms worsen or they have any concerns.       Critical Care Time(not including procedures): 31 minutes.   Due to the high risk of critical illness or multi-organ failure at initial presentation and/or during ED course.    System(s) at risk for compromise:  CNS  Critical Diagnosis:   1. Seizure  The patient was Hypotensive:   No     The patient was Hypoxic:   No     This does not including time spent performing  other reported procedures or services.   Critical care time involved full attention to the patient's condition and included:   Review of nursing notes and/or old charts - Yes  Documentation time - Yes  Care, transfer of care, and discharge plans - Yes  Obtaining necessary history from family, EMS, nursing home staff and/or treating physicians - Yes  Review of medications, allergies, and vital signs - Yes   Consultant collaboration on findings and treatment options - Yes  Ordering, interpreting, and reviewing diagnostic studies/tab tests - Yes                Medical Decision Making     Chase Holden is a 28 y.o. male presenting with witnessed seizures intermittently over 15 minutes. no incontinence or tongue trauma. tonic clonic. presented to ED confused and agitated. after ativan and observation, pt symptoms improved. Now oriented x3 with no neuro deficit. previous history of seizures, no meds ever started. pt counseled extensively. neurology and pcp follow up. keppra IV given in the ED, will [place] with keppra PO and close neurology follow up. known history of migraines, migraine meds also given in the ED but confusion and agitation unlikely to be migraine related, and more consistent with postictal state. Head CT normal. no indication for spinal tap, meningitis, SAH, or other acute emergent etiology.     Amount and/or Complexity of Data Reviewed  First MD: Orlinda Blalock, MD  I am the first provider for this patient  I have reviewed the vital signs, past medical hx, past surgical hx, social hx, family hx.  Clinical lab tests ordered and reviewed by myself: yes  All lab results interpreted by myself: yes  All radiology ordered and reviewed by myself: yes  All radiologic studies interpreted by myself: yes  Independent visualization of images, tracings, or specimens: yes  Discuss the patient with other providers: yes  Obtained patient's old records: yes  Nursing notes reviewed: yes  O2 sat reviewed :  normal  _______________________________    Attestations:    I was acting as a Neurosurgeon for Chase Holden on [employer]   Treatment Team: Scribe: Chase Holden; Scribe: Eliezer Bottom       I am the first provider for this patient and I personally performed the services documented.  Treatment Team: Scribe: Chase Holden; Scribe: Eliezer Bottom  is scribing for me on [employer].This note accurately reflects work and decisions made by me.   Orlinda Blalock, MD      _______________________________         Orlinda Blalock, MD  09/28/18 [phone]

## 2018-09-28 NOTE — ED Notes (Signed)
Bed: N 37  Expected date:   Expected time:   Means of arrival: [id number] - Chase Holden  Comments:  Medic [id number]

## 2018-09-28 NOTE — ED Notes (Signed)
Glucose 101 - reported to Dr. Toney Rakes

## 2018-09-28 NOTE — Discharge Instructions (Signed)
Dear Mr. Paulsen:    Thank you for choosing the [facility] Emergency Department, the premier emergency department in the [place] area.  I hope your visit today was EXCELLENT.    Specific instructions for your visit today:  - Please take levetiracetam (Keppra) as prescribed  - Do not drive or operate heavy machinery until these activities are approved by a neurologist  - Please follow-up with your neurologist at the [place] in [place], [place] within the next week  - Please follow-up with your primary care manager with regard to the management of your diabetes    Seizure    You have been seen today because you had a seizure.    A seizure happens when there is a sudden change in the normal electrical signals in the brain. These signals are sometimes thought of as "brain waves." During a seizure, the normal electrical activity turns into a lightening storm. The burst of signals is what causes seizure symptoms.    Some people have seizure disorders. This means they keep having more seizures over time. Seizure disorders are caused by abnormal areas in the brain (possibly a very small areas of scar tissue) that start the lightning storms. Sometimes, the first seizure is the beginning of a "seizure disorder." Epilepsy is an older word for "seizure disorder."   Other things can cause a seizure in someone who has never had a seizure before. These include alcohol withdrawal, withdrawal from certain drugs, especially "Benzodiazepines" (lorazepam (Ativan), diazepam (Valium), alprazolam (Xanax), etc.), illegal drug use, low blood sugar, brain infections, brain tumors, severe head injury (even if it was a while ago), and other medical conditions. A child may have a seizure from a rapid temperature change with a fever (temperature higher than 100.75F / 38C).    There are several different kinds of seizures:   Tonic-Clonic or Grand Mal Seizure: This kind of seizure is probably the most familiar. The person having the  seizure will lose consciousness, become stiff, and have jerking movements of both arms and both legs. The seizure lasts a few minutes. After the seizure ends, there is a "post-ictal (after seizure) period." This period can last up to a few hours. During this time, the person will be very groggy and confused then slowly return to normal.   Absence or Petit-Mal Seizure: People who have these seizures will stare blankly for short periods--up to a minute or so. During this time, they are not aware of anything around them. These seizures are not usually followed by a post-ictal period.   Focal Seizures: This type of seizure starts with only one part of the body twitching. The twitching may stay only in an arm or a leg. It may spread until the whole body is twitching. The person does not lose consciousness unless the twitching spreads to the whole body.   Temporal-Lobe Seizure: This kind of seizure causes sudden odd behavior. The person having the seizure might become violent or laugh for no reason. The person might have strange movements, like lip smacking or chewing. This kind of seizure is not common.    Because this was your first seizure, you may have had x-rays or lab tests.    You have been referred to a neurologist (a doctor who deals with seizures). Call to get an appointment to see this doctor.    Make sure that your regular doctor knows about this seizure.    Stay with a responsible adult tonight. If you have another seizure, this  person should not put anything in your mouth or try to hold you still.    Avoid alcohol.    For your safety and the safety of others, DO NOT drive a motor vehicle or operate any other heavy equipment until your regular doctor or neurologist says that you can!    YOU SHOULD SEEK MEDICAL ATTENTION IMMEDIATELY, EITHER HERE OR AT THE NEAREST EMERGENCY DEPARTMENT, IF ANY OF THE FOLLOWING OCCURS:   You have another seizure.   You have a fever (temperature higher than 100.65F /  38C), especially with a headache or neck stiffness.    IF YOU DO NOT CONTINUE TO IMPROVE OR YOUR CONDITION WORSENS, PLEASE CONTACT YOUR DOCTOR OR RETURN IMMEDIATELY TO THE EMERGENCY DEPARTMENT.    Sincerely,  Orlinda Blalock, MD  Attending Emergency Physician  [facility] Emergency Department    ONSITE PHARMACY  Our full service onsite pharmacy is located in the ER waiting room.  Open 7 days a week from 9 am to 9 pm.  We accept all major insurances and prices are competitive with major retailers.  Ask your provider to print your prescriptions down to the pharmacy to speed you on your way home.    OBTAINING A PRIMARY CARE APPOINTMENT    Primary care physicians (PCPs, also known as primary care doctors) are either internists or family medicine doctors. Both types of PCPs focus on health promotion, disease prevention, patient education and counseling, and treatment of acute and chronic medical conditions.    Call for an appointment with a primary care doctor.  Ask to see who is taking new patients.     [facility]  telephone:  [phone]  [email or web address]    DOCTOR REFERRALS  Call [phone] (available 24 hours a day, 7 days a week) if you need any further referrals and we can help you find a primary care doctor or specialist.  Also, available online at:  [email or web address]    YOUR CONTACT INFORMATION  Before leaving please check with registration to make sure we have an up-to-date contact number.  You can call registration at [phone] to update your information.  For questions about your hospital bill, please call [phone].  For questions about your Emergency Dept Physician bill please call [phone].      FREE HEALTH SERVICES  If you need help with health or social services, please call 2-1-1 for a free referral to resources in your area.  2-1-1 is a free service connecting people with information on health insurance, free clinics, pregnancy, mental health,  dental care, food assistance, housing, and substance abuse counseling.  Also, available online at:  http://www.[facility].org    MEDICAL RECORDS AND TESTS  Certain laboratory test results do not come back the same day, for example urine cultures.   We will contact you if other important findings are noted.  Radiology films are often reviewed again to ensure accuracy.  If there is any discrepancy, we will notify you.      Please call [phone] to pick up a complimentary CD of any radiology studies performed.  If you or your doctor would like to request a copy of your medical records, please call [phone].      ORTHOPEDIC INJURY   Please know that significant injuries can exist even when an initial x-ray is read as normal or negative.  This can occur because some fractures (broken bones) are not initially visible on x-rays.  For this reason, close outpatient follow-up with your primary care doctor  or bone specialist (orthopedist) is required.    MEDICATIONS AND FOLLOWUP  Please be aware that some prescription medications can cause drowsiness.  Use caution when driving or operating machinery.    The examination and treatment you have received in our Emergency Department is provided on an emergency basis, and is not intended to be a substitute for your primary care physician.  It is important that your doctor checks you again and that you report any new or remaining problems at that time.      24 HOUR PHARMACIES  The nearest 24 hour pharmacy is:    [facility]  [address]  [place], [place] [address]  [phone]      ASSISTANCE WITH INSURANCE    Affordable Care Act  [facility])  Call to start or finish an application, compare plans, enroll or ask a question.  [phone]  TTY: [phone]  Web:  Healthcare.gov    Help Enrolling in [facility]  [employer] [place]  (912)077-7725 (TOLL-FREE)  [phone] (TTY)  Web:  Http://www.coverva.org    Local Help Enrolling in the [facility]  [place]  Family Service  [phone] (MAIN)  Email:  health-help@nvfs .org  Web:  BlackjackMyths.is  Address:  [address], [address] [place], [place] [address]    SEDATING MEDICATIONS  Sedating medications include strong pain medications (e.g. narcotics), muscle relaxers, benzodiazepines (used for anxiety and as muscle relaxers), Benadryl/diphenhydramine and other antihistamines for allergic reactions/itching, and other medications.  If you are unsure if you have received a sedating medication, please ask your physician or nurse.  If you received a sedating medication: DO NOT drive a car. DO NOT operate machinery. DO NOT perform jobs where you need to be alert.  DO NOT drink alcoholic beverages while taking this medicine.     If you get dizzy, sit or lie down at the first signs. Be careful going up and down stairs.  Be extra careful to prevent falls.     Never give this medicine to others.     Keep this medicine out of reach of children.     Do not take or save old medicines. Throw them away when outdated.     Keep all medicines in a cool, dry place. DO NOT keep them in your bathroom medicine cabinet or in a cabinet above the stove.    MEDICATION REFILLS  Please be aware that we cannot refill any prescriptions through the ER. If you need further treatment from what is provided at your ER visit, please follow up with your primary care doctor or your pain management specialist.    FREESTANDING EMERGENCY DEPARTMENTS OF [facility]  Did you know [facility] has two freestanding ERs located just a few miles away?  [facility] [place] and [facility] [place] have short wait times, easy free parking directly in front of the building and top patient satisfaction scores - and the same Board Certified Emergency Medicine doctors as [facility].

## 2018-09-28 NOTE — ED Provider Notes (Signed)
[facility] EMERGENCY DEPARTMENT RESIDENT H&P       CLINICAL INFORMATION        HPI:      Chief Complaint: Seizures  .    Chase Holden is a 28 y.o. 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 The patient has a reported history of seizures and a brain aneurysm (unable to corroborate in EMR). Not on anti-epileptics. History of migraines. Per witness reports, patient was on the ground with full body shakes (one coworker reported this was on and off for 15 minutes). No evidence of other injury or bowel/bladder incontinence. Patient was "spitting up" earlier in the morning, complaining of not feeling well. Of note, report of another coworker with flu-like symptoms.     History obtained from: EMS, coworker          ROS:      Review of Systems   Unable to perform ROS: Mental status change         Physical Exam:      Pulse 97   BP 156/88   Resp 18   SpO2 100 %   Temp 98 F (36.7 C)    Physical Exam  Constitutional:       General: He is in acute distress.      Appearance: He is normal weight.      Comments: On initial exam, patient in acute distress. Exclaiming "my head!" and "I need my meds". Despite altered mental status, patient was able to state his name and location.   HENT:      Head: Normocephalic and atraumatic.      Mouth/Throat:      Mouth: Mucous membranes are dry.      Pharynx: Oropharynx is clear.      Comments: No oral injuries visible  Eyes:      Extraocular Movements: Extraocular movements intact.      Conjunctiva/sclera: Conjunctivae normal.      Pupils: Pupils are equal, round, and reactive to light.   Cardiovascular:      Rate and Rhythm: Normal rate and regular rhythm.      Pulses: Normal pulses.      Heart sounds: No murmur. No friction rub. No gallop.    Pulmonary:      Effort: Pulmonary effort is normal.      Breath sounds: Normal breath sounds.   Abdominal:       General: There is no distension.      Tenderness: There is no abdominal tenderness. There is no guarding or rebound.   Musculoskeletal:         General: No swelling, tenderness, deformity or signs of injury.   Neurological:      General: No focal deficit present.      Cranial Nerves: No cranial nerve deficit.      Sensory: No sensory deficit.      Motor: No weakness.      Coordination: Coordination normal.      Deep Tendon Reflexes: Reflexes normal.                 PAST HISTORY        Primary Care Provider: Pcp, None, MD        PMH/PSH:    .     Past Medical History:   Diagnosis Date    Diabetes mellitus     Hypertension  He has a past surgical history that includes Brain surgery.      Social/Family History:      He reports that he has never smoked. He has never used smokeless tobacco. He reports that he does not drink alcohol or use drugs.    No family history on file.      Listed Medications on Arrival:    .     Home Medications     Med List Status:  In Progress Set By: Claudie Leach, RN at 09/28/2018  8:44 AM        No Medications        Ongoing Comment    Elder Cyphers, RN    07/19/2017  1:11 AM    Patient unsure of meds - were recently changed. Takes meds for HTN and diabetes.         Allergies: He is allergic to epipen [epinephrine]; eggs or egg-derived products; poultry; and soybean oil.            VISIT INFORMATION        Reassessments/Clinical Course:    POC glucose 101    Noncontrast CT head without acute abnormality.    As patient's mental status improved, he provided history that he had 3-4 seizures previously, all in the year 2019, but had never been evaluated by a physician for these. However, he is followed by a neurologist at the [facility] for migraines. Additionally, he reports he has type 1 diabetes but has not been taking his insulin for several days, is unable to state how many units he takes, and demonstrates no understanding of how insulin should be used (e.g. stated he would give  it to himself if his sugar was low).          Conversations with Other Providers:              Medications Given in the ED:    .     ED Medication Orders (From admission, onward)    Start Ordered     Status Ordering Provider    09/28/18 1100 09/28/18 1002  topiramate (TOPAMAX) tablet 50 mg  Once     Route: Oral  Ordered Dose: 50 mg     Ordered Ario Mcdiarmid R    09/28/18 1000 09/28/18 0909    Once     Route: Oral  Ordered Dose: 100 mg     Discontinued Iliana Hutt R    09/28/18 0910 09/28/18 0909  SUMAtriptan Succinate (IMITREX) injection 6 mg  Once     Route: Subcutaneous  Ordered Dose: 6 mg     Last MAR action:  Given Odus Clasby R    09/28/18 0825 09/28/18 0824  LORazepam (ATIVAN) injection 1 mg  Once     Route: Intravenous  Ordered Dose: 1 mg     Last MAR action:  Given Haydn Cush R            Procedures:      Procedures      Assessment/Plan:     28 yo M presenting with reported seizure-like activity, found to be altered, with progressive improvement, in the setting of a normal noncontrast CT head, and a normal neurologic exam. Differential includes seizure, pseudoseizure, and atypical migraine. Given work-up thus far, less likely hypoglycemia, medication/drug-induced, electrolyte abnormality, infection (e.g. meningitis, encephalitis), intracranial hemorrhage, or space-occupying lesion. Patient's initial profoundly altered state with subsequent improvement is most consistent with a post-ictal state. Given concern for seizure, will initiate  therapy with levetiracetam (1g IV loading dose in the ED). Emphasized importance of not driving or operating heavy machinery (e.g. related to construction) unless approved by a neurologist in the future. Emphasized importance of close follow-up with outpatient neurology. Patient preferred follow-up with his neurologist at the [place] in [place], [place]; stated he would return to [place], [place] within the next week to do so. Patient expressed understanding and agreement  with the plan.          Jaquelyn Bitter, MD  Resident  09/28/18 1054       Jaquelyn Bitter, MD  Resident  09/28/18 [phone]

## 2018-11-03 ENCOUNTER — Encounter (HOSPITAL_COMMUNITY): Payer: Self-pay | Admitting: Emergency Medicine

## 2018-11-03 ENCOUNTER — Emergency Department (HOSPITAL_COMMUNITY): Payer: Non-veteran care

## 2018-11-03 ENCOUNTER — Other Ambulatory Visit: Payer: Self-pay

## 2018-11-03 ENCOUNTER — Emergency Department (HOSPITAL_COMMUNITY)
Admission: EM | Admit: 2018-11-03 | Discharge: 2018-11-03 | Disposition: A | Discharge status: Home / Self Care | Payer: Non-veteran care | Attending: Emergency Medicine | Admitting: Emergency Medicine

## 2018-11-03 DIAGNOSIS — R112 Nausea with vomiting, unspecified: Secondary | ICD-10-CM | POA: Insufficient documentation

## 2018-11-03 DIAGNOSIS — R197 Diarrhea, unspecified: Secondary | ICD-10-CM | POA: Insufficient documentation

## 2018-11-03 DIAGNOSIS — E119 Type 2 diabetes mellitus without complications: Secondary | ICD-10-CM | POA: Insufficient documentation

## 2018-11-03 DIAGNOSIS — R569 Unspecified convulsions: Secondary | ICD-10-CM | POA: Insufficient documentation

## 2018-11-03 DIAGNOSIS — I10 Essential (primary) hypertension: Secondary | ICD-10-CM | POA: Insufficient documentation

## 2018-11-03 LAB — CBC
HCT: 52.7 % — ABNORMAL HIGH (ref 39.0–52.0)
Hemoglobin: 18 g/dL — ABNORMAL HIGH (ref 13.0–17.0)
MCH: 30.1 pg (ref 26.0–34.0)
MCHC: 34.2 g/dL (ref 30.0–36.0)
MCV: 88 fL (ref 80.0–100.0)
Platelets: 258 10*3/uL (ref 150–400)
RBC: 5.99 MIL/uL — ABNORMAL HIGH (ref 4.22–5.81)
RDW: 12.3 % (ref 11.5–15.5)
WBC: 8.7 10*3/uL (ref 4.0–10.5)
nRBC: 0 % (ref 0.0–0.2)

## 2018-11-03 LAB — COMPREHENSIVE METABOLIC PANEL
ALT: 26 U/L (ref 0–44)
AST: 24 U/L (ref 15–41)
Albumin: 4.4 g/dL (ref 3.5–5.0)
Alkaline Phosphatase: 56 U/L (ref 38–126)
Anion gap: 14 (ref 5–15)
BUN: 13 mg/dL (ref 6–20)
CO2: 19 mmol/L — ABNORMAL LOW (ref 22–32)
Calcium: 9.9 mg/dL (ref 8.9–10.3)
Chloride: 102 mmol/L (ref 98–111)
Creatinine, Ser: 1.29 mg/dL — ABNORMAL HIGH (ref 0.61–1.24)
GFR calc Af Amer: >60 mL/min (ref >60)
GFR calc non Af Amer: >60 mL/min (ref >60)
Glucose, Bld: 107 mg/dL — ABNORMAL HIGH (ref 70–99)
Potassium: 4.4 mmol/L (ref 3.5–5.1)
Sodium: 135 mmol/L (ref 135–145)
Total Bilirubin: 2.3 mg/dL — ABNORMAL HIGH (ref 0.3–1.2)
Total Protein: 7.8 g/dL (ref 6.5–8.1)

## 2018-11-03 LAB — CBG MONITORING, ED: Glucose-Capillary: 92 mg/dL (ref 70–99)

## 2018-11-03 LAB — LIPASE, BLOOD: Lipase: 23 U/L (ref 11–51)

## 2018-11-03 MED ORDER — SODIUM CHLORIDE 0.9 % IV BOLUS
1000.0000 mL | Freq: Once | INTRAVENOUS | Status: AC
  Administered 2018-11-03: 1000 mL via INTRAVENOUS

## 2018-11-03 MED ORDER — ONDANSETRON HCL 4 MG/2ML IJ SOLN
4.0000 mg | Freq: Once | INTRAMUSCULAR | Status: AC
  Administered 2018-11-03: 4 mg via INTRAVENOUS
  Filled 2018-11-03: qty 2

## 2018-11-03 NOTE — Discharge Instructions (Addendum)
It was our pleasure to provide your ER care today - we hope that you feel better.  Rest. Drink plenty of fluids.  Follow up with primary care doctor in the next 1-2 days for recheck if symptoms fail to resolve.  For possible seizure activity, follow up with neurologist in 1 week - call office tomorrow to arrange appointment. No driving or operating heavy machinery until cleared to do so by your doctor/neurologist.   Return to ER right away if worse, new symptoms, new or severe pain, persistent vomiting, other concern.

## 2018-11-03 NOTE — ED Triage Notes (Signed)
Pt arrives to ED with complaints of a witnessed seizure like activity while getting out of his car at the entrance of the ED, pt fell and struck his head. Pt was on his way to the ED because of N/V/D & abdominal pain that started today. Pt is post ictal on arrival to the room.

## 2018-11-03 NOTE — ED Notes (Signed)
Pt given water per EDP. 

## 2018-11-03 NOTE — ED Provider Notes (Addendum)
[facility] EMERGENCY DEPARTMENT Provider Note   CSN: [id number] Arrival date & time: 11/03/18  1548    History   Chief Complaint Chief Complaint  Patient presents with  . Seizures    HPI Mark Collins is a 28 y.o. male.     Patient presents to ED initially with c/o nausea, vomiting, diarrhea in past day, but then when got out of car, fell to ground with possible seizure/twitching activity lasting a minute or two. ?postictal/confused afterwards. ?hx one prior seizure but never on meds for same.no incontinence.  Pt limited historian, confused - level 5 caveat.   The history is provided by the patient. The history is limited by the condition of the patient.  Seizures    Past Medical History:  Diagnosis Date  . Brain aneurysm 2018  . Diabetes mellitus without complication (HCC)   . Hypertension     There are no active problems to display for this patient.   No past surgical history on file.      Home Medications    Prior to Admission medications   Not on File    Family History No family history on file.  Social History Social History   Tobacco Use  . Smoking status: Never Smoker  . Smokeless tobacco: Never Used  Substance Use Topics  . Alcohol use: Yes  . Drug use: Not Currently     Allergies   Patient has no known allergies.   Review of Systems Review of Systems  Unable to perform ROS: Mental status change  Constitutional: Negative for fever.  HENT: Negative for sore throat.   Eyes: Negative for redness and visual disturbance.  Respiratory: Negative for cough and shortness of breath.   Cardiovascular: Negative for chest pain.  Gastrointestinal: Positive for diarrhea and vomiting. Negative for abdominal pain.  Genitourinary: Negative for dysuria and flank pain.  Musculoskeletal: Negative for back pain and neck pain.  Skin: Negative for rash and wound.  Neurological: Positive for seizures. Negative for speech difficulty and  headaches.  Hematological: Does not bruise/bleed easily.  Psychiatric/Behavioral: Negative for dysphoric mood.  level 5 caveat, altered mental status/decreased responsiveness.    Physical Exam Updated Vital Signs BP (!) 148/84 (BP Location: Right Arm)   Pulse 97   Temp 98 F (36.7 C) (Oral)   Resp 12   SpO2 100%   Physical Exam Vitals signs and nursing note reviewed.  Constitutional:      Appearance: Normal appearance. He is well-developed.  HENT:     Head: Atraumatic.     Nose: Nose normal.     Mouth/Throat:     Mouth: Mucous membranes are moist.     Pharynx: Oropharynx is clear.  Eyes:     General: No scleral icterus.    Extraocular Movements: Extraocular movements intact.     Conjunctiva/sclera: Conjunctivae normal.     Pupils: Pupils are equal, round, and reactive to light.  Neck:     Musculoskeletal: Normal range of motion and neck supple. No neck rigidity.     Vascular: No carotid bruit.     Trachea: No tracheal deviation.  Cardiovascular:     Rate and Rhythm: Normal rate and regular rhythm.     Pulses: Normal pulses.     Heart sounds: Normal heart sounds. No murmur. No friction rub. No gallop.   Pulmonary:     Effort: Pulmonary effort is normal. No accessory muscle usage or respiratory distress.     Breath sounds: Normal breath sounds.  Abdominal:     General: Bowel sounds are normal. There is no distension.     Palpations: Abdomen is soft. There is no mass.     Tenderness: There is no abdominal tenderness. There is no guarding or rebound.     Hernia: No hernia is present.  Genitourinary:    Comments: No cva tenderness. Normal external gu exam.  Musculoskeletal:        General: No swelling.     Comments: CTLS spine, non tender, aligned, no step off. Good rom bil ext without pain or focal bony tenderness.   Skin:    General: Skin is warm and dry.     Findings: No rash.  Neurological:     General: No focal deficit present.     Mental Status: He is alert  and oriented to person, place, and time.     Comments: Patient initially, very briefly not consistently responsive to questions, although with prompting would answer certain questions clearly. Now. Alert, speech clear/fluent. Motor intact bil, stre 5/5. sens grossly intact bil. Steady gait.   Psychiatric:        Mood and Affect: Mood normal.      ED Treatments / Results  Labs (all labs ordered are listed, but only abnormal results are displayed) Results for orders placed or performed during the hospital encounter of 11/03/18  CBC  Result Value Ref Range   WBC 8.7 4.0 - 10.5 K/uL   RBC 5.99 (H) 4.22 - 5.81 MIL/uL   Hemoglobin 18.0 (H) 13.0 - 17.0 g/dL   HCT 81.1 (H) 91.4 - 78.2 %   MCV 88.0 80.0 - 100.0 fL   MCH 30.1 26.0 - 34.0 pg   MCHC 34.2 30.0 - 36.0 g/dL   RDW 95.6 21.3 - 08.6 %   Platelets 258 150 - 400 K/uL   nRBC 0.0 0.0 - 0.2 %  Comprehensive metabolic panel  Result Value Ref Range   Sodium 135 135 - 145 mmol/L   Potassium 4.4 3.5 - 5.1 mmol/L   Chloride 102 98 - 111 mmol/L   CO2 19 (L) 22 - 32 mmol/L   Glucose, Bld 107 (H) 70 - 99 mg/dL   BUN 13 6 - 20 mg/dL   Creatinine, Ser 5.78 (H) 0.61 - 1.24 mg/dL   Calcium 9.9 8.9 - 46.9 mg/dL   Total Protein 7.8 6.5 - 8.1 g/dL   Albumin 4.4 3.5 - 5.0 g/dL   AST 24 15 - 41 U/L   ALT 26 0 - 44 U/L   Alkaline Phosphatase 56 38 - 126 U/L   Total Bilirubin 2.3 (H) 0.3 - 1.2 mg/dL   GFR calc non Af Amer >60 >60 mL/min   GFR calc Af Amer >60 >60 mL/min   Anion gap 14 5 - 15  Lipase, blood  Result Value Ref Range   Lipase 23 11 - 51 U/L  CBG monitoring, ED  Result Value Ref Range   Glucose-Capillary 92 70 - 99 mg/dL   Comment 1 Notify RN    Comment 2 Document in Chart     EKG EKG Interpretation  Date/Time:  Sunday November 03 2018 15:50:06 EDT Ventricular Rate:  93 PR Interval:    QRS Duration: 96 QT Interval:  314 QTC Calculation: 391 R Axis:   101 Text Interpretation:  Sinus rhythm Nonspecific T wave abnormality  No previous tracing Confirmed by Cathren Laine ([id number]) on 11/03/2018 3:52:55 PM   Radiology Ct Head Wo Contrast  Result Date: 11/03/2018 CLINICAL  DATA:  Witnessed seizure-like activity. Patient fell striking head. Nausea vomiting, diarrhea and abdominal pain. EXAM: CT HEAD WITHOUT CONTRAST TECHNIQUE: Contiguous axial images were obtained from the base of the skull through the vertex without intravenous contrast. COMPARISON:  CT 05/15/2018. FINDINGS: Brain: There is no evidence of acute intracranial hemorrhage, mass lesion, brain edema or extra-axial fluid collection. The ventricles and subarachnoid spaces are appropriately sized for age. There is no CT evidence of acute cortical infarction. Vascular:  No hyperdense vessel identified. Skull: Negative for fracture or focal lesion. Sinuses/Orbits: Mild ethmoid sinus mucosal thickening, similar to previous study. The visualized paranasal sinuses, mastoid air cells and middle ears are otherwise clear. No orbital abnormalities. Other: None. IMPRESSION: Minimal ethmoid sinus mucosal thickening, similar to previous study. No acute intracranial findings. Electronically Signed   By: Carey BullocksWilliam  Veazey M.D.   On: 11/03/2018 17:11    Procedures Procedures (including critical care time)  Medications Ordered in ED Medications  sodium chloride 0.9 % bolus 1,000 mL (has no administration in time range)  ondansetron (ZOFRAN) injection 4 mg (has no administration in time range)     Initial Impression / Assessment and Plan / ED Course  I have reviewed the triage vital signs and the nursing notes.  Pertinent labs & imaging results that were available during my care of the patient were reviewed by me and considered in my medical decision making (see chart for details).  Iv ns bolus. zofran iv. Seizure precautions. Stat labs and imaging ordered.   Patient is alert, oriented. No seizure activity observed while in ED. Normal neuro exam.   Reviewed nursing notes and  prior charts for additional history.   Labs reviewed by me - hco3 sl low ?dehydration. Additional bolus of ns iv.   Trial of po fluids.  Recheck abd soft, non tender. No recurrent emesis.   Ct reviewed by me - no ICH.  Discussed results w pt.   Recheck again, fluids staying down. No pain. No headache. No neck or back pain. No abd pain. abd soft nt.   ?earlier shaking episode, ?syncope with shaking/tremulousness, vs seizure.   Patient currently appears stable for d/c.  Return precautions provided.    Final Clinical Impressions(s) / ED Diagnoses   Final diagnoses:  None    ED Discharge Orders    None         Cathren LaineSteinl, Dmiyah Liscano, MD 11/03/18 [phone]

## 2019-08-15 ENCOUNTER — Other Ambulatory Visit: Payer: Self-pay

## 2019-08-15 ENCOUNTER — Other Ambulatory Visit: Payer: Self-pay | Admitting: Occupational Medicine

## 2019-08-15 ENCOUNTER — Ambulatory Visit: Payer: Self-pay

## 2019-08-15 DIAGNOSIS — M25571 Pain in right ankle and joints of right foot: Secondary | ICD-10-CM

## 2019-12-01 ENCOUNTER — Emergency Department (HOSPITAL_COMMUNITY)
Admission: EM | Admit: 2019-12-01 | Discharge: 2019-12-01 | Disposition: A | Discharge status: Home / Self Care | Payer: No Typology Code available for payment source | Attending: Emergency Medicine | Admitting: Emergency Medicine

## 2019-12-01 ENCOUNTER — Emergency Department (HOSPITAL_COMMUNITY): Payer: No Typology Code available for payment source

## 2019-12-01 ENCOUNTER — Encounter (HOSPITAL_COMMUNITY): Payer: Self-pay | Admitting: Emergency Medicine

## 2019-12-01 ENCOUNTER — Other Ambulatory Visit: Payer: Self-pay

## 2019-12-01 DIAGNOSIS — I10 Essential (primary) hypertension: Secondary | ICD-10-CM | POA: Insufficient documentation

## 2019-12-01 DIAGNOSIS — E119 Type 2 diabetes mellitus without complications: Secondary | ICD-10-CM | POA: Insufficient documentation

## 2019-12-01 DIAGNOSIS — R0789 Other chest pain: Secondary | ICD-10-CM | POA: Diagnosis not present

## 2019-12-01 LAB — CBC
HCT: 47.2 % (ref 39.0–52.0)
Hemoglobin: 16 g/dL (ref 13.0–17.0)
MCH: 30.1 pg (ref 26.0–34.0)
MCHC: 33.9 g/dL (ref 30.0–36.0)
MCV: 88.7 fL (ref 80.0–100.0)
Platelets: 265 10*3/uL (ref 150–400)
RBC: 5.32 MIL/uL (ref 4.22–5.81)
RDW: 12.7 % (ref 11.5–15.5)
WBC: 7.7 10*3/uL (ref 4.0–10.5)
nRBC: 0 % (ref 0.0–0.2)

## 2019-12-01 LAB — BASIC METABOLIC PANEL
Anion gap: 13 (ref 5–15)
BUN: 15 mg/dL (ref 6–20)
CO2: 24 mmol/L (ref 22–32)
Calcium: 9.7 mg/dL (ref 8.9–10.3)
Chloride: 104 mmol/L (ref 98–111)
Creatinine, Ser: 1.1 mg/dL (ref 0.61–1.24)
GFR calc Af Amer: >60 mL/min (ref >60)
GFR calc non Af Amer: >60 mL/min (ref >60)
Glucose, Bld: 100 mg/dL — ABNORMAL HIGH (ref 70–99)
Potassium: 4 mmol/L (ref 3.5–5.1)
Sodium: 141 mmol/L (ref 135–145)

## 2019-12-01 LAB — TROPONIN I (HIGH SENSITIVITY)
Troponin I (High Sensitivity): 3 ng/L (ref <18)
Troponin I (High Sensitivity): 3 ng/L (ref <18)

## 2019-12-01 MED ORDER — OMEPRAZOLE 20 MG PO CPDR: 20.0000 mg | DELAYED_RELEASE_CAPSULE | Freq: Every day | ORAL | 0 refills | Status: AC

## 2019-12-01 MED ORDER — SODIUM CHLORIDE 0.9% FLUSH: 3.0000 mL | Freq: Once | INTRAVENOUS | Status: DC

## 2019-12-01 NOTE — ED Provider Notes (Signed)
[facility] EMERGENCY DEPARTMENT Provider Note   CSN: [id number] Arrival date & time: 12/01/19  7893     History Chief Complaint  Patient presents with  . Chest Pain    Mark Collins is a 29 y.o. male.  The history is provided by the patient and medical records. No language interpreter was used.  Chest Pain    29 year old male with history of diabetes, hypertension, brain aneurysm, brought here via EMS from home for evaluation of chest pain.  Patient report for the past 2 weeks he has been experiencing intermittent chest discomfort.  Last episode was this morning when he was sleeping.  He was awoke with discomfort to the left side of his chest radiates towards his mid chest, described as a sharp heavy uncomfortable sensation lasting for approximately 20 minutes with some mild nausea but now the pain has resolved.  He mention eating late last night but denies any recent alcohol use eating spicy food.  He denies any significant family history of premature cardiac death.  He denies any recent injury or heavy lifting.  He denies any specific treatment tried.  Pain is minimal at this time.  Past Medical History:  Diagnosis Date  . Brain aneurysm 2018  . Diabetes mellitus without complication ([place])   . Hypertension     There are no problems to display for this patient.   History reviewed. No pertinent surgical history.     No family history on file.  Social History   Tobacco Use  . Smoking status: Never Smoker  . Smokeless tobacco: Never Used  Substance Use Topics  . Alcohol use: Yes  . Drug use: Not Currently    Home Medications Prior to Admission medications   Not on File    Allergies    Patient has no known allergies.  Review of Systems   Review of Systems  Cardiovascular: Positive for chest pain.  All other systems reviewed and are negative.   Physical Exam Updated Vital Signs BP (!) 142/98   Pulse 71   Temp 97.7 F (36.5 C) (Oral)    Resp 15   Ht 5\' 8"  (1.727 m)   Wt 72.6 kg   SpO2 99%   BMI 24.33 kg/m   Physical Exam Vitals and nursing note reviewed.  Constitutional:      General: He is not in acute distress.    Appearance: He is well-developed.  HENT:     Head: Atraumatic.  Eyes:     Conjunctiva/sclera: Conjunctivae normal.  Cardiovascular:     Rate and Rhythm: Normal rate and regular rhythm.     Heart sounds: Normal heart sounds.  Pulmonary:     Effort: Pulmonary effort is normal.     Breath sounds: Normal breath sounds.  Chest:     Chest wall: No tenderness.  Abdominal:     Palpations: Abdomen is soft.     Tenderness: There is no abdominal tenderness.  Musculoskeletal:     Cervical back: Neck supple.     Right lower leg: No edema.     Left lower leg: No edema.  Skin:    Capillary Refill: Capillary refill takes less than 2 seconds.     Findings: No rash.  Neurological:     Mental Status: He is alert.  Psychiatric:        Mood and Affect: Mood normal.     ED Results / Procedures / Treatments   Labs (all labs ordered are listed, but only abnormal  results are displayed) Labs Reviewed  BASIC METABOLIC PANEL - Abnormal; Notable for the following components:      Result Value   Glucose, Bld 100 (*)    All other components within normal limits  CBC  TROPONIN I (HIGH SENSITIVITY)  TROPONIN I (HIGH SENSITIVITY)    EKG EKG Interpretation  Date/Time:  Monday Dec 01 2019 05:58:42 EDT Ventricular Rate:  67 PR Interval:  154 QRS Duration: 94 QT Interval:  372 QTC Calculation: 393 R Axis:   90 Text Interpretation: Normal sinus rhythm Rightward axis Borderline ECG flipped t waves in inferior and lateral leads resolved Otherwise no significant change Confirmed by Melene Plan [phone]) on 12/01/2019 8:16:49 AM   Radiology DG Chest 2 View  Result Date: 12/01/2019 CLINICAL DATA:  Intermittent left chest pain, woke from sleep EXAM: CHEST - 2 VIEW COMPARISON:  None. FINDINGS: No consolidation,  features of edema, pneumothorax, or effusion. Pulmonary vascularity is normally distributed. The cardiomediastinal contours are unremarkable. No acute osseous abnormality. Mild gaseous distention of the transverse colon and splenic flexure. No other soft tissue abnormality. IMPRESSION: No acute cardiopulmonary abnormality. Nonspecific gaseous distention of the splenic flexure and transverse colon. Correlate for abdominal symptoms. Electronically Signed   By: Kreg Shropshire M.D.   On: 12/01/2019 06:31    Procedures Procedures (including critical care time)  Medications Ordered in ED Medications  sodium chloride flush (NS) 0.9 % injection 3 mL (has no administration in time range)    ED Course  I have reviewed the triage vital signs and the nursing notes.  Pertinent labs & imaging results that were available during my care of the patient were reviewed by me and considered in my medical decision making (see chart for details).    MDM Rules/Calculators/A&P                      BP (!) 139/98   Pulse 70   Temp 97.7 F (36.5 C) (Oral)   Resp 20   Ht 5\' 8"  (1.727 m)   Wt 72.6 kg   SpO2 96%   BMI 24.33 kg/m   Final Clinical Impression(s) / ED Diagnoses Final diagnoses:  Atypical chest pain    Rx / DC Orders ED Discharge Orders         Ordered    omeprazole (PRILOSEC) 20 MG capsule  Daily     12/01/19 1031         Pt presents with chest pain. An EKG was immediately obtained which shows no ST segment elevation. The patient was placed on a cardiopulmonary monitor, oxygen and an IV established. We will obtain comprehensive laboratory studies including cardiac enzymes in addition to a chest x-ray. sxs suggestive of gastritis/gerd.  HEART score of 1, low risk of MACE.   10:30 AM Negative delta trop.  Pt resting comfortably.  Stable for discharge.  outpt f/u recommended.  Return precaution given.    12/03/19, PA-C 12/01/19 1032    12/03/19, DO 12/01/19 1038

## 2019-12-01 NOTE — ED Notes (Signed)
Got patient into a gown on the monitor patient is resting with call bell in reach 

## 2019-12-01 NOTE — ED Notes (Signed)
Patient verbalizes understanding of discharge instructions. Opportunity for questioning and answers were provided. Armband removed by staff, pt discharged from ED to home via POV. A&Ox4, ambulatory at DC

## 2019-12-01 NOTE — ED Triage Notes (Signed)
Pt in via [facility] w/sharp 10/10 L chest pain. States the pain has been sharp and intermittent x 2 wks, but tonight it woke him up, and is worse w/deep breaths. Denies any fevers or cough.

## 2021-08-07 IMAGING — DX DG ANKLE COMPLETE 3+V*R*
3 series · 3 of 3 positions shown · non-contrast
Comparison: None.

CLINICAL DATA: Right ankle pain, ankle injury 08/07/2019

EXAM:
RIGHT ANKLE - COMPLETE 3+ VIEW

[ankle ap]
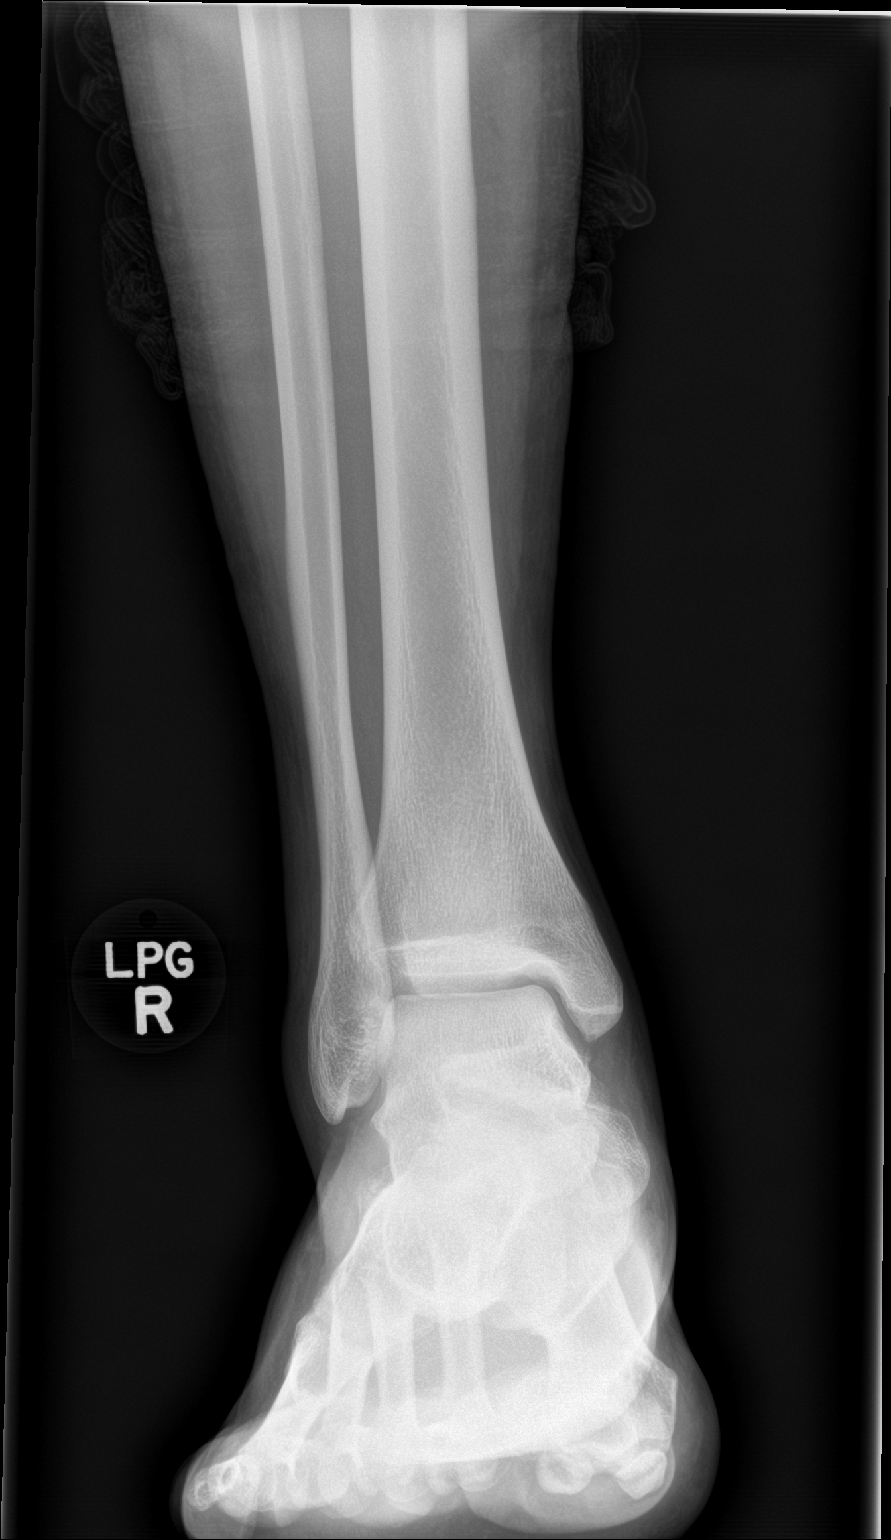

[ankle mortise]
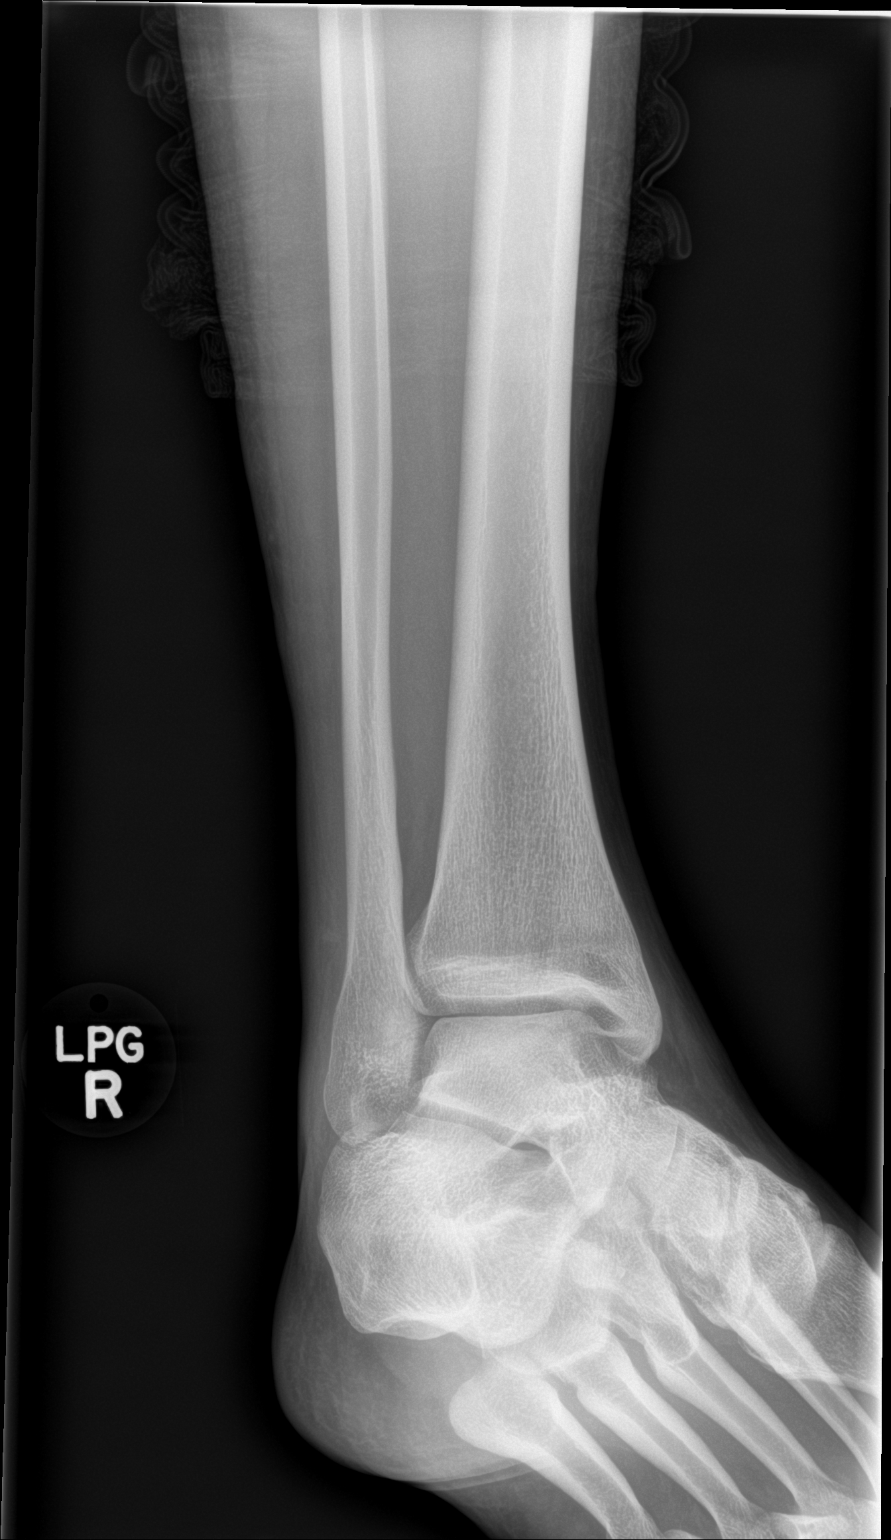

[ankle lat]
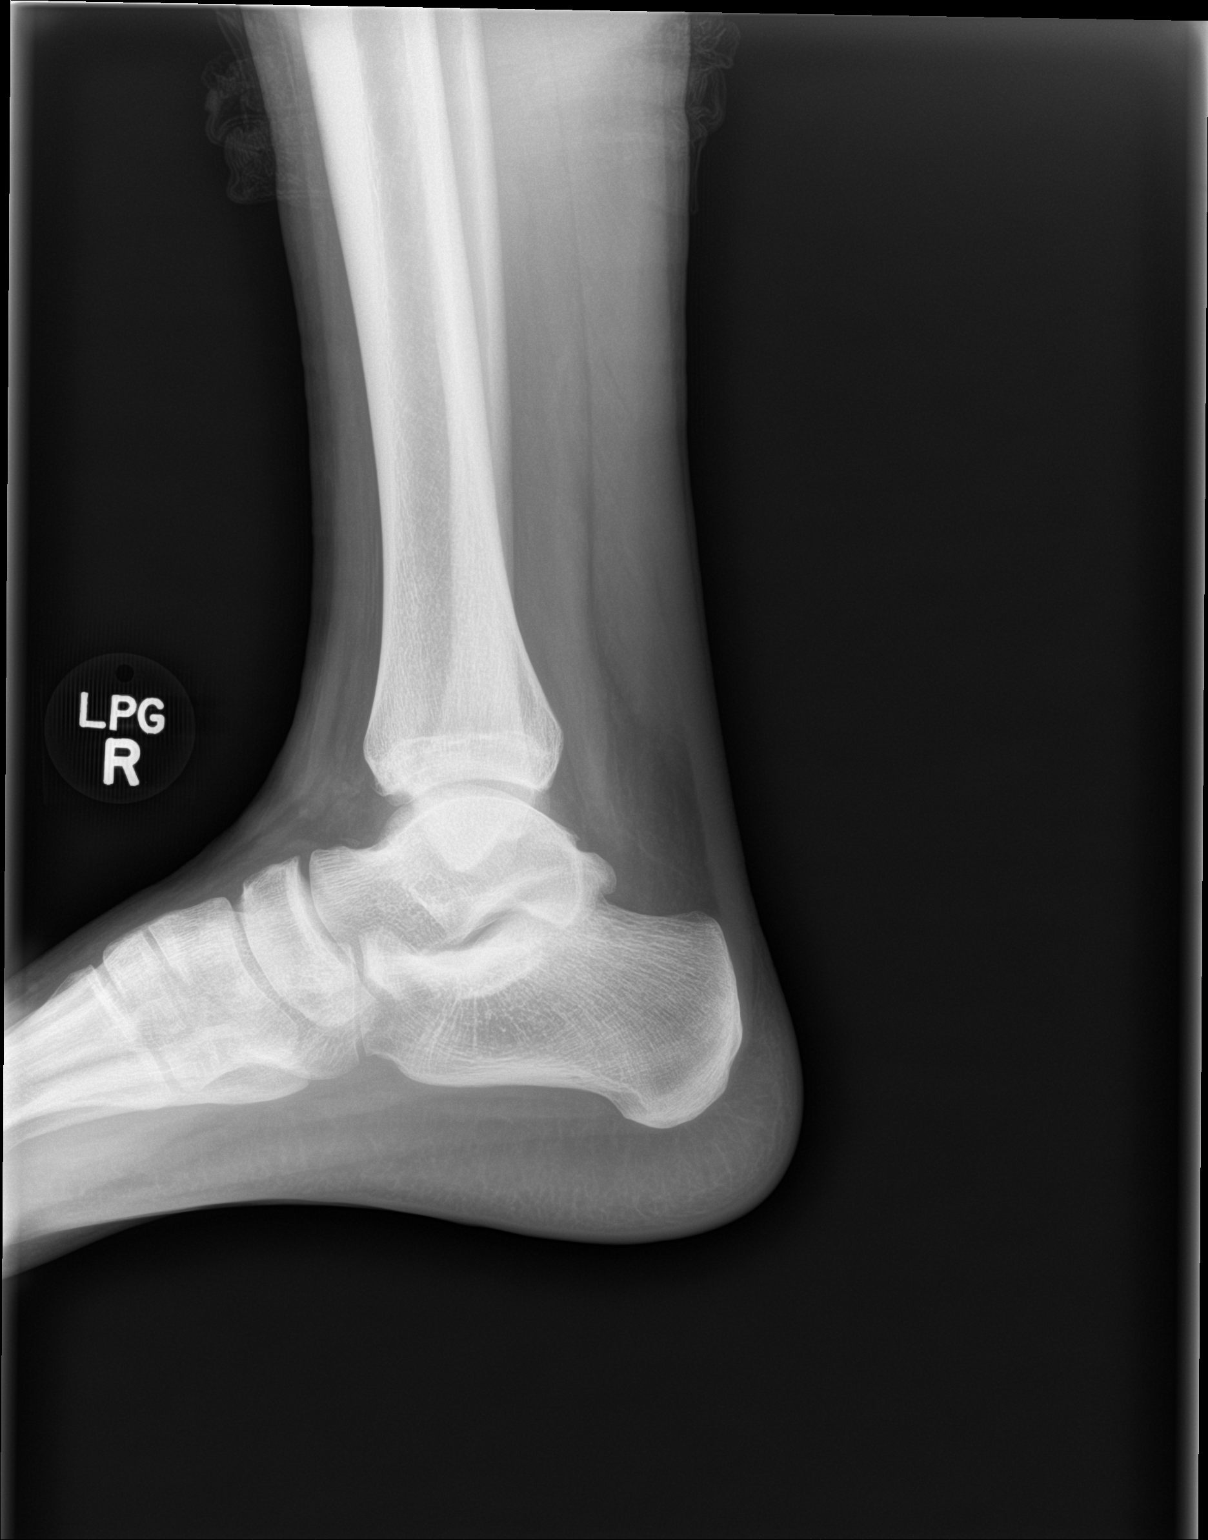

[3 of 3 positions shown; findings below may reference images not displayed]

FINDINGS: There is no evidence of fracture, dislocation, or joint effusion.
There is no evidence of arthropathy or other focal bone abnormality.
Soft tissues are unremarkable.
IMPRESSION: No fracture or dislocation of the right ankle. Joint spaces are well
preserved.

## 2022-05-22 ENCOUNTER — Other Ambulatory Visit (HOSPITAL_COMMUNITY): Payer: Self-pay | Admitting: Physician Assistant

## 2022-05-22 DIAGNOSIS — M545 Low back pain, unspecified: Secondary | ICD-10-CM

## 2022-05-31 ENCOUNTER — Ambulatory Visit (HOSPITAL_COMMUNITY): Admission: RE | Admit: 2022-05-31 | Payer: No Typology Code available for payment source | Source: Ambulatory Visit

## 2022-05-31 ENCOUNTER — Encounter (HOSPITAL_COMMUNITY): Payer: Self-pay
